# Patient Record
Sex: Female | Born: 1980 | Race: Black or African American | Hispanic: No | Marital: Single | Smoking: Current some day smoker
Health system: Southern US, Community
[De-identification: ages and names within clinical notes are randomized; demographics above are authoritative.]

## PROBLEM LIST (undated history)

## (undated) DIAGNOSIS — E119 Type 2 diabetes mellitus without complications: Secondary | ICD-10-CM

## (undated) HISTORY — PX: APPENDECTOMY: SHX54

## (undated) HISTORY — PX: TUBAL LIGATION: SHX77

---

## 2003-05-30 ENCOUNTER — Inpatient Hospital Stay (HOSPITAL_COMMUNITY): Admission: AD | Admit: 2003-05-30 | Discharge: 2003-05-30 | Payer: Self-pay | Admitting: Obstetrics & Gynecology

## 2004-03-05 ENCOUNTER — Emergency Department (HOSPITAL_COMMUNITY): Admission: EM | Admit: 2004-03-05 | Discharge: 2004-03-05 | Payer: Self-pay | Admitting: Emergency Medicine

## 2004-03-31 ENCOUNTER — Inpatient Hospital Stay (HOSPITAL_COMMUNITY): Admission: AD | Admit: 2004-03-31 | Discharge: 2004-04-01 | Payer: Self-pay | Admitting: Obstetrics

## 2004-04-06 ENCOUNTER — Inpatient Hospital Stay (HOSPITAL_COMMUNITY): Admission: AD | Admit: 2004-04-06 | Discharge: 2004-04-09 | Payer: Self-pay | Admitting: Obstetrics & Gynecology

## 2004-04-23 ENCOUNTER — Inpatient Hospital Stay (HOSPITAL_COMMUNITY): Admission: AD | Admit: 2004-04-23 | Discharge: 2004-04-23 | Payer: Self-pay | Admitting: Obstetrics

## 2004-04-24 ENCOUNTER — Ambulatory Visit (HOSPITAL_COMMUNITY): Admission: RE | Admit: 2004-04-24 | Discharge: 2004-04-24 | Payer: Self-pay | Admitting: Obstetrics & Gynecology

## 2004-05-07 ENCOUNTER — Inpatient Hospital Stay (HOSPITAL_COMMUNITY): Admission: AD | Admit: 2004-05-07 | Discharge: 2004-06-03 | Payer: Self-pay | Admitting: Obstetrics

## 2005-01-24 ENCOUNTER — Emergency Department (HOSPITAL_COMMUNITY): Admission: EM | Admit: 2005-01-24 | Discharge: 2005-01-24 | Payer: Self-pay | Admitting: Emergency Medicine

## 2005-03-21 ENCOUNTER — Ambulatory Visit (HOSPITAL_COMMUNITY): Admission: RE | Admit: 2005-03-21 | Discharge: 2005-03-21 | Payer: Self-pay | Admitting: Obstetrics & Gynecology

## 2005-10-01 ENCOUNTER — Ambulatory Visit (HOSPITAL_COMMUNITY): Admission: RE | Admit: 2005-10-01 | Discharge: 2005-10-01 | Payer: Self-pay | Admitting: Obstetrics

## 2005-10-02 ENCOUNTER — Inpatient Hospital Stay (HOSPITAL_COMMUNITY): Admission: RE | Admit: 2005-10-02 | Discharge: 2005-10-05 | Payer: Self-pay | Admitting: Obstetrics

## 2005-10-18 IMAGING — US US FETAL BPP W/O NONSTRESS
1 series · 14 of 28 positions shown · non-contrast
Comparison: none

CLINICAL DATA: 23-year-old.  G3 P0 for biophysical profile.

[Series 1: unknown · 0.37mm/px · 14 of 39 slices shown]
[im 2/39]
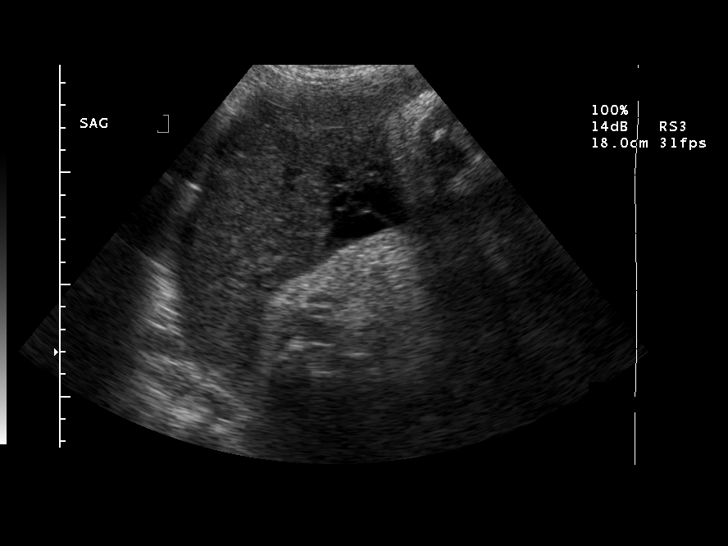
[im 5/39]
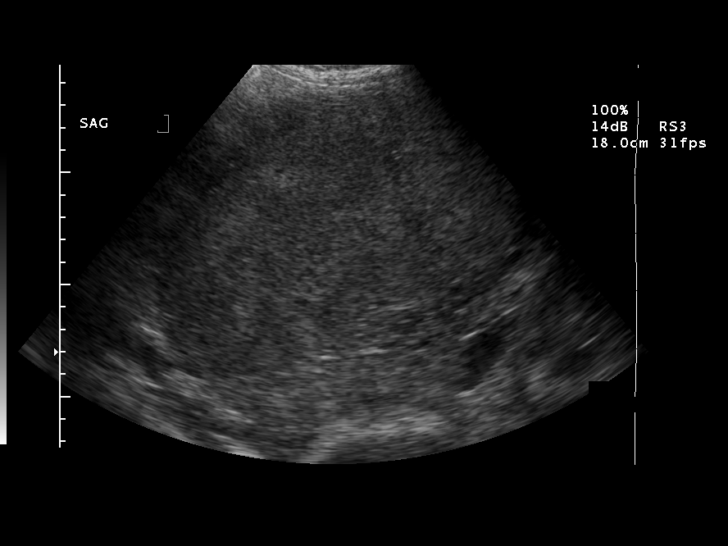
[im 8/39]
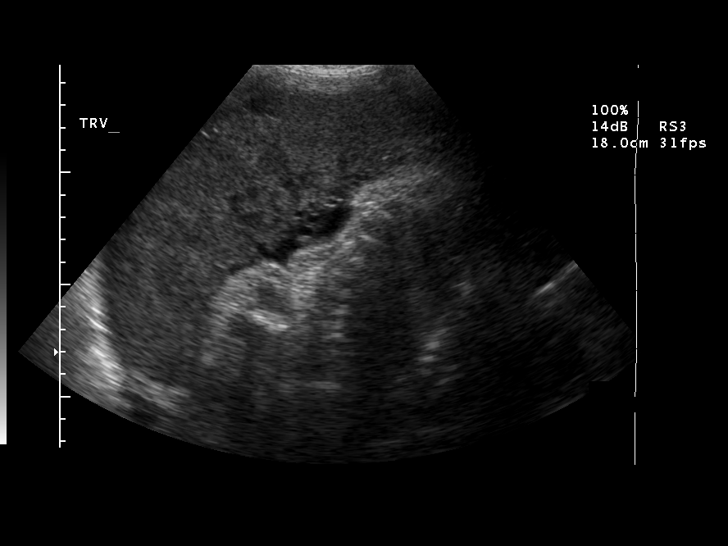
[im 10/39]
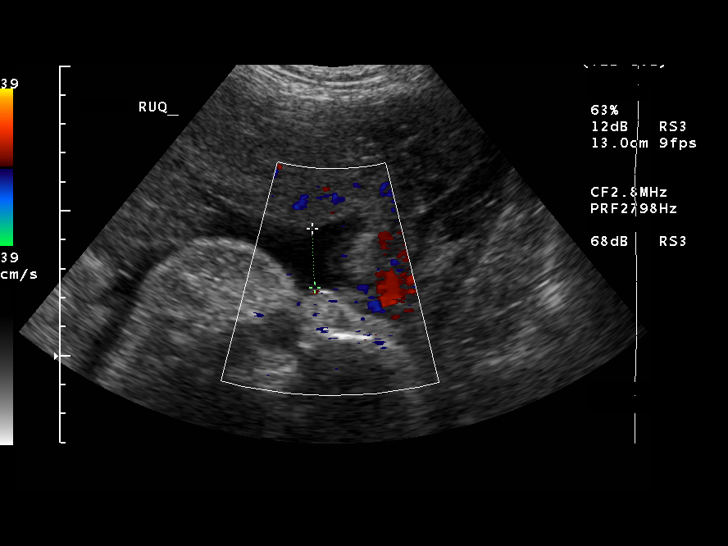
[im 13/39]
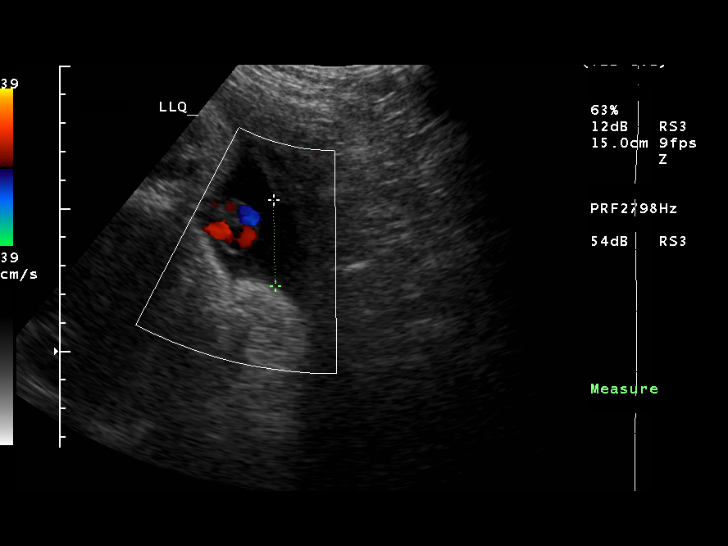
[im 16/39]
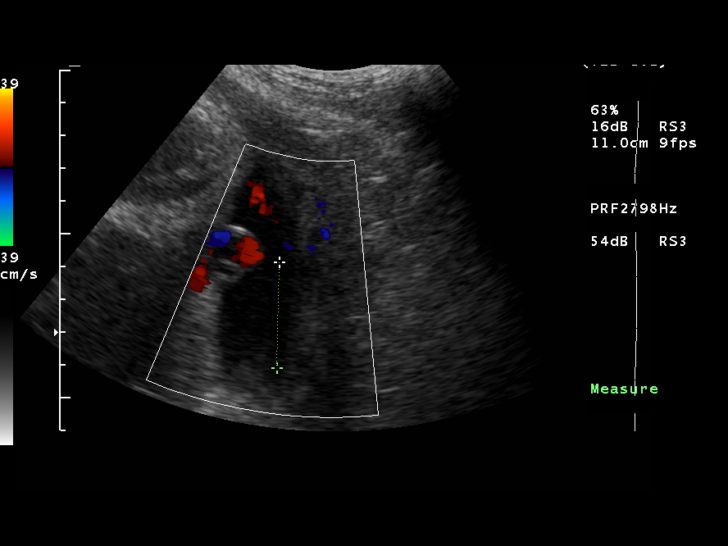
[im 19/39]
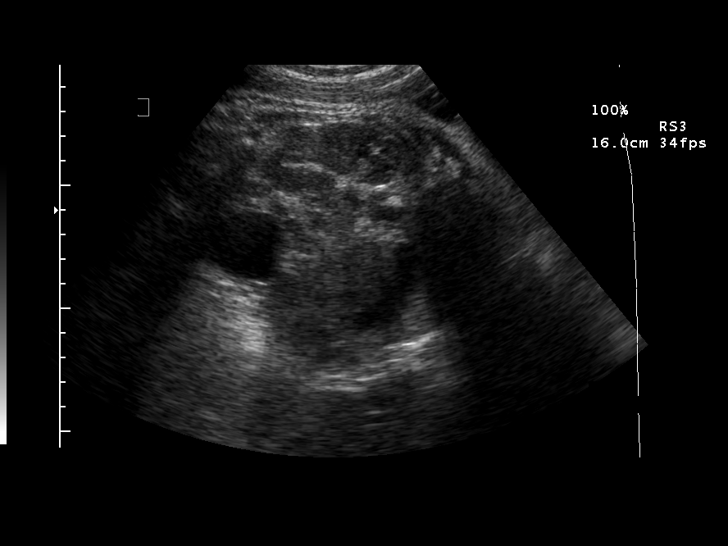
[im 22/39]
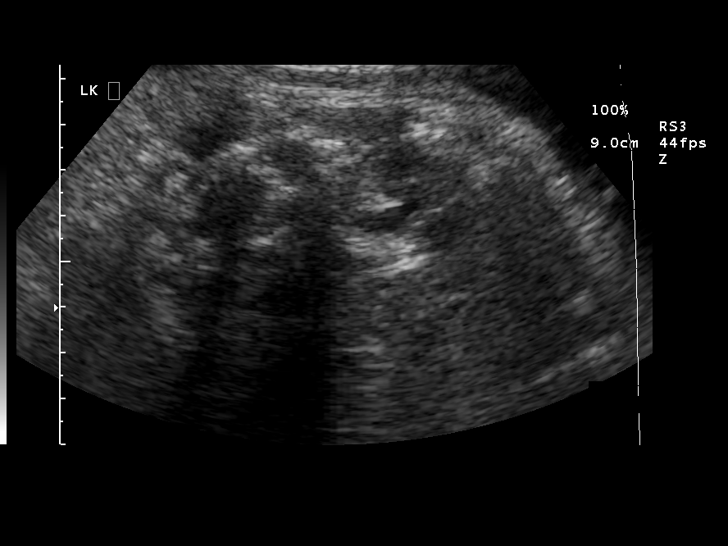
[im 24/39]
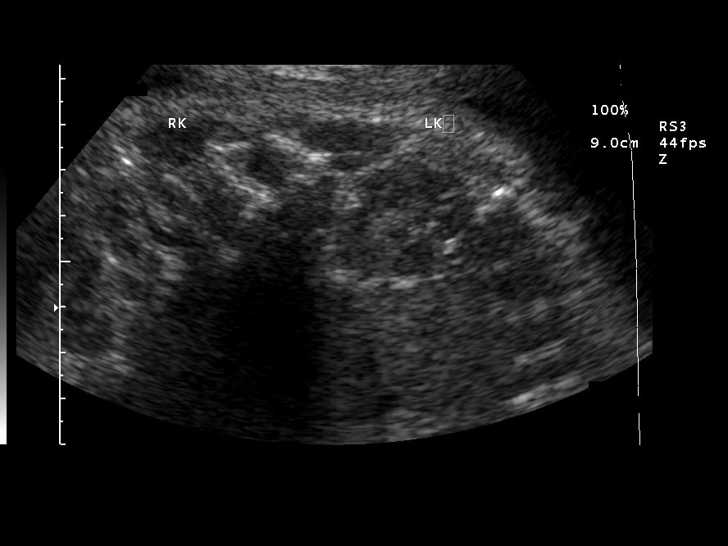
[im 27/39]
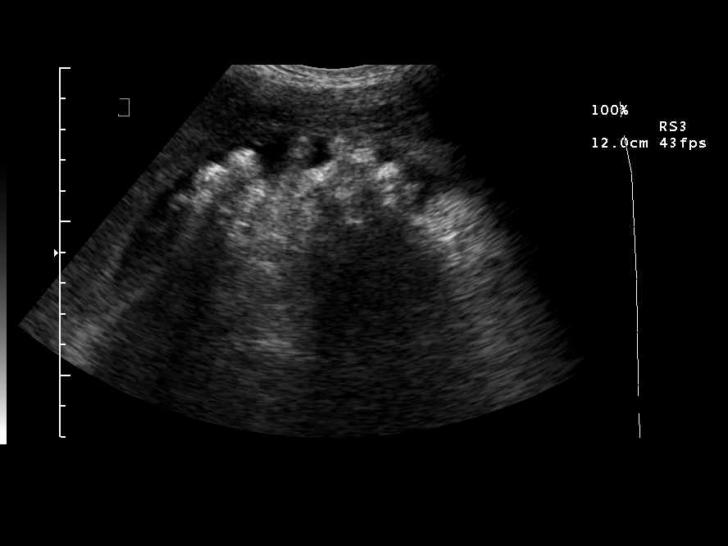
[im 30/39]
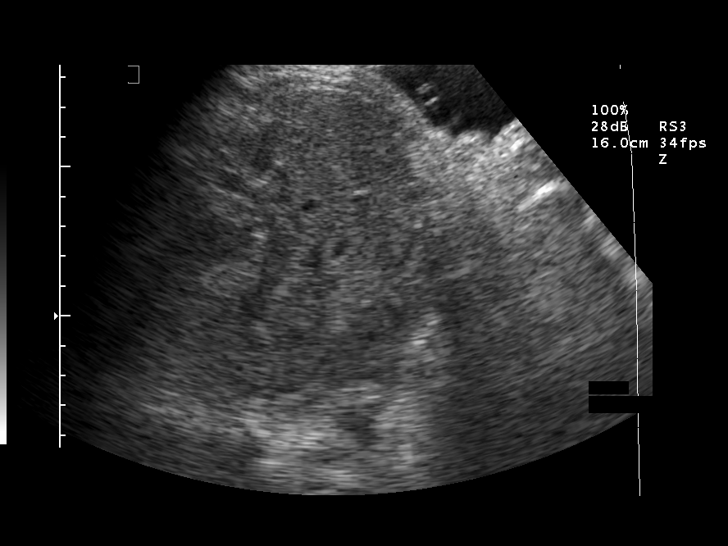
[im 33/39]
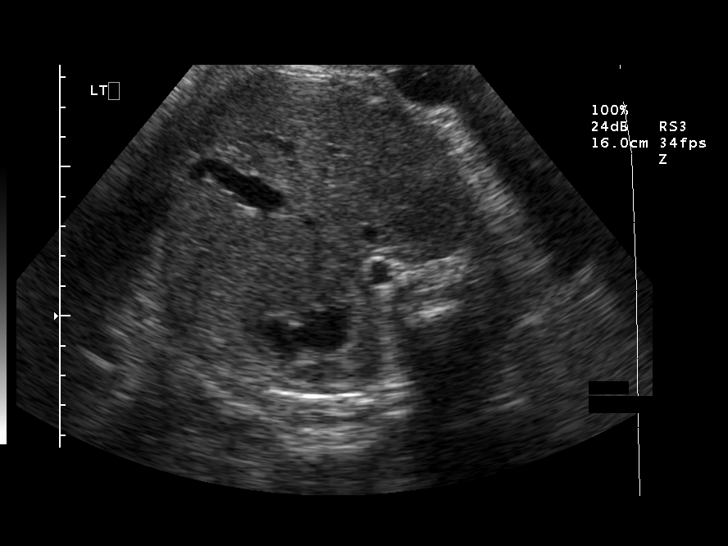
[im 36/39]
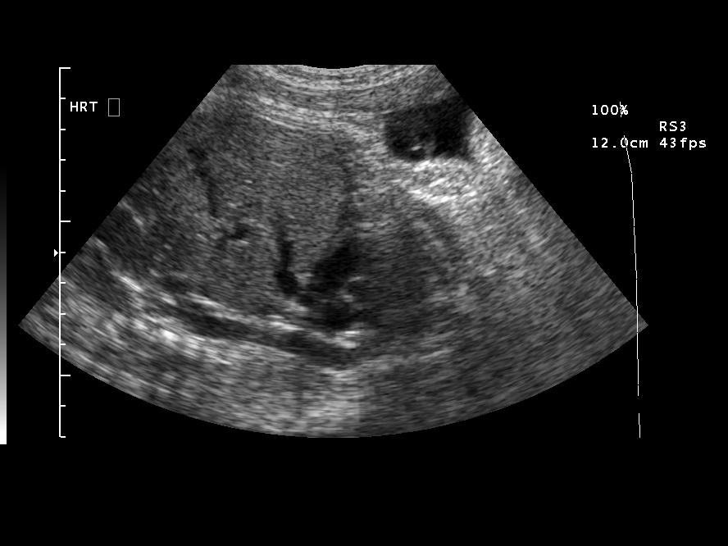
[im 39/39]
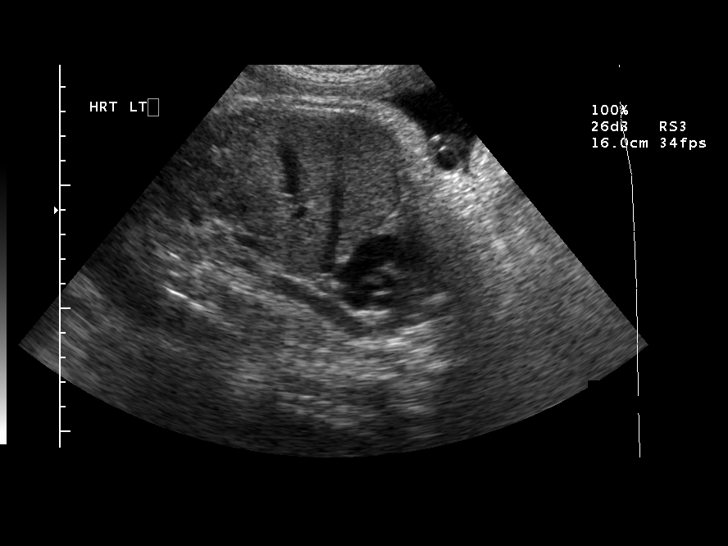

[14 of 28 positions shown; findings below may reference images not displayed]

BIOPHYSICAL PROFILE

 Number of Fetuses:  1
 Heart rate:  137
 Movement:  Yes
 Breathing:  No
 Presentation:  Cephalic/Oblique
 Placental Location:  Fundal, posterior
 Grade:  II
 Previa:  No
 Amniotic Fluid (Subjective):  Normal
 Amniotic Fluid (Objective):  14.7 cm AFI (5th -95th%ile = 7.5 ? 24.4 cm for 37 wks)

 Fetal measurements and complete anatomic evaluation were not requested.  The following fetal anatomy was visualized on this exam:  Stomach, kidneys, bladder, and diaphragm. 

 BPP SCORING
 Movements:  2  Time:  30 minutes
 Breathing:  0
 Tone:  2
 Amniotic Fluid:  2
 Total Score:  6

 MATERNAL FINDINGS:
 Cervix:  Not evaluated
IMPRESSION: Single living intrauterine fetus in oblique presentation.  Amniotic fluid volume is within normal limits.  Biophysical profile is 6 of 8.
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
## 2007-03-04 IMAGING — US US AMNIOCENTESIS
1 series · 2 of 2 positions shown · non-contrast
Comparison: none

[Series 1: us amniocentesis · 2 of 2 slices shown]
[im 1/2]
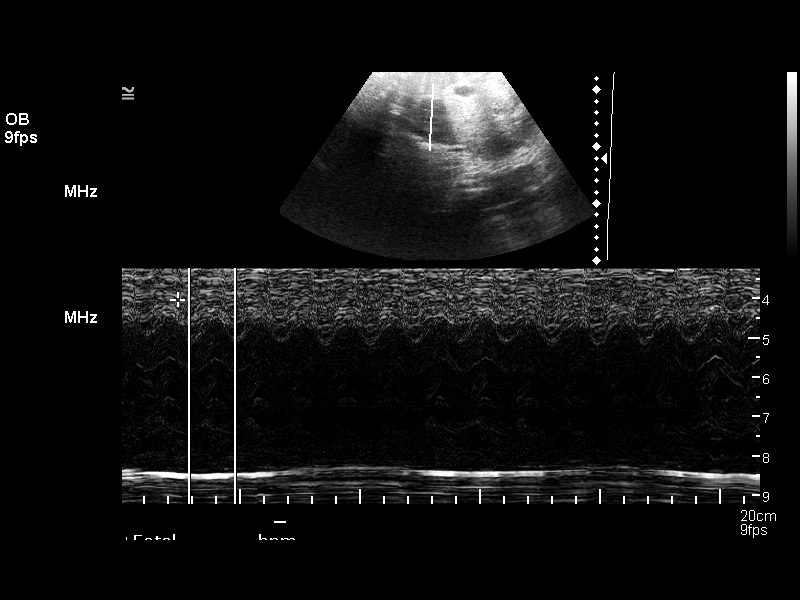
[im 2/2]
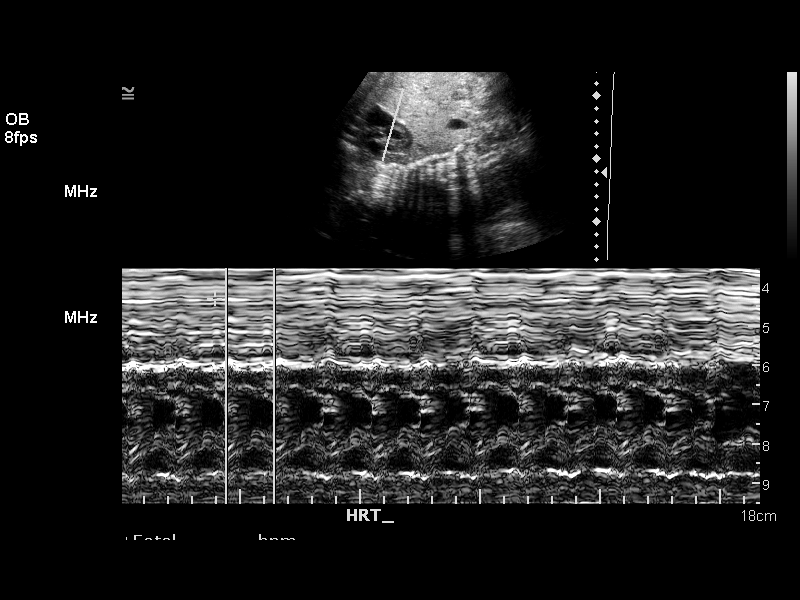

[2 of 2 positions shown; findings below may reference images not displayed]

ULTRASOUND AMNIOCENTESIS:

 Ultrasound was utilized to perform amniocentesis by the requesting physician.

## 2017-01-29 ENCOUNTER — Ambulatory Visit (HOSPITAL_COMMUNITY)
Admission: EM | Admit: 2017-01-29 | Discharge: 2017-01-29 | Disposition: A | Discharge status: Home / Self Care | Payer: Medicaid Other | Attending: Internal Medicine | Admitting: Internal Medicine

## 2017-01-29 ENCOUNTER — Encounter (HOSPITAL_COMMUNITY): Payer: Self-pay | Admitting: *Deleted

## 2017-01-29 DIAGNOSIS — R300 Dysuria: Secondary | ICD-10-CM | POA: Diagnosis not present

## 2017-01-29 DIAGNOSIS — Z79899 Other long term (current) drug therapy: Secondary | ICD-10-CM | POA: Diagnosis not present

## 2017-01-29 DIAGNOSIS — Z794 Long term (current) use of insulin: Secondary | ICD-10-CM | POA: Diagnosis not present

## 2017-01-29 DIAGNOSIS — D649 Anemia, unspecified: Secondary | ICD-10-CM | POA: Diagnosis not present

## 2017-01-29 DIAGNOSIS — N390 Urinary tract infection, site not specified: Secondary | ICD-10-CM | POA: Diagnosis not present

## 2017-01-29 DIAGNOSIS — R350 Frequency of micturition: Secondary | ICD-10-CM | POA: Insufficient documentation

## 2017-01-29 DIAGNOSIS — E860 Dehydration: Secondary | ICD-10-CM | POA: Insufficient documentation

## 2017-01-29 DIAGNOSIS — E1165 Type 2 diabetes mellitus with hyperglycemia: Secondary | ICD-10-CM | POA: Insufficient documentation

## 2017-01-29 HISTORY — DX: Type 2 diabetes mellitus without complications: E11.9

## 2017-01-29 LAB — POCT URINALYSIS DIP (DEVICE)
BILIRUBIN URINE: NEGATIVE
KETONES UR: NEGATIVE mg/dL
Nitrite: NEGATIVE
Protein, ur: 30 mg/dL — AB
SPECIFIC GRAVITY, URINE: 1.02 (ref 1.005–1.030)
Urobilinogen, UA: 1 mg/dL (ref 0.0–1.0)
pH: 6 (ref 5.0–8.0)

## 2017-01-29 LAB — POCT I-STAT, CHEM 8
BUN: 12 mg/dL (ref 6–20)
CHLORIDE: 101 mmol/L (ref 101–111)
CREATININE: 0.6 mg/dL (ref 0.44–1.00)
Calcium, Ion: 1.16 mmol/L (ref 1.15–1.40)
GLUCOSE: 277 mg/dL — AB (ref 65–99)
HEMATOCRIT: 32 % — AB (ref 36.0–46.0)
Hemoglobin: 10.9 g/dL — ABNORMAL LOW (ref 12.0–15.0)
POTASSIUM: 4 mmol/L (ref 3.5–5.1)
Sodium: 137 mmol/L (ref 135–145)
TCO2: 25 mmol/L (ref 0–100)

## 2017-01-29 MED ORDER — CEPHALEXIN 500 MG PO CAPS: 500.0000 mg | ORAL_CAPSULE | Freq: Four times a day (QID) | ORAL | 0 refills | Status: DC

## 2017-01-29 NOTE — ED Triage Notes (Signed)
Patient woke up this am dysuria and polyuria, denies any abdominal pain.

## 2017-01-29 NOTE — Discharge Instructions (Signed)
Be sure to take your blood sugar daily. It is currently 277. I think that you probably have some mild dehydration which may be causing your heart rate to be elevated. Continue to drink plenty of fluids. If in the next few days her heart rate does not come back down to normal that would be worrisome and you need to follow-up with your primary care doctor. You are being treated for urinary tract infection. A culture of your urine has been ordered as well. He may also take AZO for urinary tract infection symptoms. This tends to work relatively quickly and get help before the antibiotics start working. He will turn her urine reddish orange. If he started developing fevers, weakness, vomiting or other illness go to the emergency department promptly. This could mean that you have an infection in your blood strain, your kidney or problems with your diabetes. This could be serious and he would need to go to the emergency department.

## 2017-01-29 NOTE — ED Provider Notes (Signed)
CSN: [id number]     Arrival date & time 01/29/17  1421 History   None    Chief Complaint  Patient presents with  . Dysuria  . Polyuria   (Consider location/radiation/quality/duration/timing/severity/associated sxs/prior Treatment) 36 year old female with type 2 diabetes mellitus treated with insulin and metformin presents to the urgent care with a 2 day history of rather severe burning with urination and "bladder pain afterwards" and increased frequency of urination. She states that her urine seems to be cloudy and has an odor. She last checked her blood sugar yesterday and it was 123. She has noticed her heart seems to be having some palpitations and going a little fast and currently at a rate of 121.      Past Medical History:  Diagnosis Date  . Diabetes mellitus without complication (HCC)    History reviewed. No pertinent surgical history. History reviewed. No pertinent family history. Social History  Substance Use Topics  . Smoking status: Never Smoker  . Smokeless tobacco: Never Used  . Alcohol use Not on file   OB History    No data available     Review of Systems  Constitutional: Positive for activity change. Negative for fever.  HENT: Negative.   Respiratory: Negative.   Cardiovascular: Negative.   Gastrointestinal:       No current GI symptoms however last she had 3 days of vomiting and some diarrhea.  Genitourinary: Positive for dysuria, frequency, pelvic pain and urgency. Negative for vaginal discharge.  Neurological: Negative.   All other systems reviewed and are negative.   Allergies  Patient has no known allergies.  Home Medications   Prior to Admission medications   Medication Sig Start Date End Date Taking? Authorizing Provider  insulin glargine (LANTUS) 100 UNIT/ML injection Inject into the skin at bedtime.   Yes Historical Provider, MD  metFORMIN (GLUCOPHAGE) 1000 MG tablet Take 1,000 mg by mouth 2 (two) times daily with a meal.   Yes Historical  Provider, MD  cephALEXin (KEFLEX) 500 MG capsule Take 1 capsule (500 mg total) by mouth 4 (four) times daily. 01/29/17   Hayden Rasmussen, NP   Meds Ordered and Administered this Visit  Medications - No data to display  BP (!) 146/93 (BP Location: Right Arm)   Pulse (!) 121   Temp 99.6 F (37.6 C) (Oral)   Resp 18   LMP 01/19/2017   SpO2 100%  No data found.   Physical Exam  Constitutional: She is oriented to person, place, and time. She appears well-developed and well-nourished. No distress.  Eyes: EOM are normal.  Neck: Normal range of motion. Neck supple.  Cardiovascular: Normal rate, regular rhythm, normal heart sounds and intact distal pulses.   Pulmonary/Chest: Effort normal. No respiratory distress.  Musculoskeletal: She exhibits no edema.  Neurological: She is alert and oriented to person, place, and time. She exhibits normal muscle tone.  Skin: Skin is warm and dry.  Psychiatric: She has a normal mood and affect.  Nursing note and vitals reviewed.   Urgent Care Course     Procedures (including critical care time)  Labs Review Labs Reviewed  POCT URINALYSIS DIP (DEVICE) - Abnormal; Notable for the following:       Result Value   Glucose, UA >=1000 (*)    Hgb urine dipstick TRACE (*)    Protein, ur 30 (*)    Leukocytes, UA SMALL (*)    All other components within normal limits  POCT I-STAT, CHEM 8 - Abnormal; Notable for  the following:    Glucose, Bld 277 (*)    Hemoglobin 10.9 (*)    HCT 32.0 (*)    All other components within normal limits  URINE CULTURE    Imaging Review No results found.   Visual Acuity Review  Right Eye Distance:   Left Eye Distance:   Bilateral Distance:    Right Eye Near:   Left Eye Near:    Bilateral Near:         MDM   1. Lower urinary tract infectious disease   2. Dysuria   3. Type 2 diabetes mellitus with hyperglycemia, without long-term current use of insulin (HCC)   4. Mild dehydration   5. Anemia, unspecified  type    Be sure to take your blood sugar daily. It is currently 277. I think that you probably have some mild dehydration which may be causing your heart rate to be elevated. Continue to drink plenty of fluids. If in the next few days her heart rate does not come back down to normal that would be worrisome and you need to follow-up with your primary care doctor. You are being treated for urinary tract infection. A culture of your urine has been ordered as well. He may also take AZO for urinary tract infection symptoms. This tends to work relatively quickly and get help before the antibiotics start working. He will turn her urine reddish orange. If he started developing fevers, weakness, vomiting or other illness go to the emergency department promptly. This could mean that you have an infection in your blood strain, your kidney or problems with your diabetes. This could be serious and he would need to go to the emergency department.  Meds ordered this encounter  Medications  . metFORMIN (GLUCOPHAGE) 1000 MG tablet    Sig: Take 1,000 mg by mouth 2 (two) times daily with a meal.  . insulin glargine (LANTUS) 100 UNIT/ML injection    Sig: Inject into the skin at bedtime.  . cephALEXin (KEFLEX) 500 MG capsule    Sig: Take 1 capsule (500 mg total) by mouth 4 (four) times daily.    Dispense:  28 capsule    Refill:  0    Order Specific Question:   Supervising Provider    Answer:   Eustace Moore [[id number]]       Hayden Rasmussen, NP 01/29/17 1622    Hayden Rasmussen, NP 01/29/17 [id number]

## 2017-01-31 LAB — URINE CULTURE: Culture: >=100000 — AB

## 2018-04-04 ENCOUNTER — Ambulatory Visit (HOSPITAL_COMMUNITY)
Admission: EM | Admit: 2018-04-04 | Discharge: 2018-04-04 | Disposition: A | Discharge status: Home / Self Care | Payer: Medicaid Other | Attending: Family Medicine | Admitting: Family Medicine

## 2018-04-04 ENCOUNTER — Other Ambulatory Visit: Payer: Self-pay

## 2018-04-04 ENCOUNTER — Encounter (HOSPITAL_COMMUNITY): Payer: Self-pay | Admitting: *Deleted

## 2018-04-04 DIAGNOSIS — R42 Dizziness and giddiness: Secondary | ICD-10-CM

## 2018-04-04 DIAGNOSIS — E1165 Type 2 diabetes mellitus with hyperglycemia: Secondary | ICD-10-CM | POA: Diagnosis not present

## 2018-04-04 DIAGNOSIS — R739 Hyperglycemia, unspecified: Secondary | ICD-10-CM

## 2018-04-04 LAB — POCT URINALYSIS DIP (DEVICE)
Bilirubin Urine: NEGATIVE
Hgb urine dipstick: NEGATIVE
Ketones, ur: NEGATIVE mg/dL
Leukocytes, UA: NEGATIVE
NITRITE: NEGATIVE
PROTEIN: NEGATIVE mg/dL
Specific Gravity, Urine: 1.01 (ref 1.005–1.030)
UROBILINOGEN UA: 0.2 mg/dL (ref 0.0–1.0)
pH: 6 (ref 5.0–8.0)

## 2018-04-04 LAB — POCT I-STAT, CHEM 8
BUN: 10 mg/dL (ref 6–20)
CHLORIDE: 99 mmol/L (ref 98–111)
Calcium, Ion: 1.14 mmol/L — ABNORMAL LOW (ref 1.15–1.40)
Creatinine, Ser: 0.8 mg/dL (ref 0.44–1.00)
Glucose, Bld: 418 mg/dL — ABNORMAL HIGH (ref 70–99)
HCT: 31 % — ABNORMAL LOW (ref 36.0–46.0)
Hemoglobin: 10.5 g/dL — ABNORMAL LOW (ref 12.0–15.0)
Potassium: 3.9 mmol/L (ref 3.5–5.1)
Sodium: 137 mmol/L (ref 135–145)
TCO2: 23 mmol/L (ref 22–32)

## 2018-04-04 MED ORDER — INSULIN GLARGINE 100 UNIT/ML ~~LOC~~ SOLN: 20.0000 [IU] | Freq: Every day | SUBCUTANEOUS | 0 refills | Status: AC

## 2018-04-04 MED ORDER — METFORMIN HCL 1000 MG PO TABS: 1000.0000 mg | ORAL_TABLET | Freq: Two times a day (BID) | ORAL | 0 refills | Status: AC

## 2018-04-04 NOTE — Discharge Instructions (Addendum)
Please resume your metformin and continue Lantus as previously as prescribed to you  Continue to monitor your blood sugars  Please work on getting set up with a primary care  Please return here or go to emergency room if symptoms worsening, developing more fatigue, nausea, vomiting, dizziness

## 2018-04-04 NOTE — ED Triage Notes (Signed)
States her blood sugar was 412 at home today, normally takes Metformin, out of medication x 2 weeks.

## 2018-04-05 NOTE — ED Provider Notes (Signed)
[facility]    CSN: [id number] Arrival date & time: 04/04/18  1730     History   Chief Complaint Chief Complaint  Patient presents with  . Hyperglycemia    HPI Regina Robbins is a 37 y.o. female history of type 2 diabetes presenting today for evaluation of hyperglycemia and medication refill.  Patient states that she has been out of her metformin for approximately 2 weeks.  She notes that today she had increased fatigue, headache and dizziness.  She checked her blood sugar and it was around 418.  She states that approximately 2 to 3 days ago her blood sugars are running around 220.  When she was on her metformin they would typically run less than 200, but would be above 150.  She is also using Lantus, 20 units in the morning 22 at bedtime.  She does not have a PCP.  Denies nausea or vomiting.  Has noted increased frequency in urination, but denies dysuria.  HPI  Past Medical History:  Diagnosis Date  . Diabetes mellitus without complication (HCC)     There are no active problems to display for this patient.   Past Surgical History:  Procedure Laterality Date  . APPENDECTOMY    . TUBAL LIGATION      OB History   None      Home Medications    Prior to Admission medications   Medication Sig Start Date End Date Taking? Authorizing Provider  insulin glargine (LANTUS) 100 UNIT/ML injection Inject 0.2 mLs (20 Units total) into the skin at bedtime. 04/04/18   Torrin Frein C, PA-C  metFORMIN (GLUCOPHAGE) 1000 MG tablet Take 1 tablet (1,000 mg total) by mouth 2 (two) times daily with a meal. 04/04/18 07/03/18  Ledford Goodson, Junius CreamerHallie C, PA-C    Family History No family history on file.  Social History Social History   Tobacco Use  . Smoking status: Current Some Day Smoker  . Smokeless tobacco: Never Used  Substance Use Topics  . Alcohol use: Yes  . Drug use: Never     Allergies   Patient has no known allergies.   Review of Systems Review of Systems    Constitutional: Positive for fatigue. Negative for fever.  Eyes: Negative for visual disturbance.  Respiratory: Negative for shortness of breath.   Cardiovascular: Negative for chest pain.  Gastrointestinal: Negative for abdominal pain, diarrhea, nausea and vomiting.  Genitourinary: Positive for frequency. Negative for dysuria, flank pain, genital sores, hematuria, menstrual problem, vaginal bleeding, vaginal discharge and vaginal pain.  Musculoskeletal: Negative for back pain and myalgias.  Skin: Negative for rash.  Neurological: Positive for light-headedness and headaches. Negative for dizziness.     Physical Exam Triage Vital Signs ED Triage Vitals [04/04/18 1859]  Enc Vitals Group     BP 130/79     Pulse Rate 92     Resp 16     Temp 97.8 F (36.6 C)     Temp Source Oral     SpO2 97 %     Weight      Height      Head Circumference      Peak Flow      Pain Score 3     Pain Loc      Pain Edu?      Excl. in GC?    No data found.  Updated Vital Signs BP 130/79 (BP Location: Right Arm)   Pulse 92   Temp 97.8 F (36.6 C) (Oral)  Resp 16   LMP 03/28/2018   SpO2 97%   Visual Acuity Right Eye Distance:   Left Eye Distance:   Bilateral Distance:    Right Eye Near:   Left Eye Near:    Bilateral Near:     Physical Exam  Constitutional: She is oriented to person, place, and time. She appears well-developed and well-nourished. No distress.  HENT:  Head: Normocephalic and atraumatic.  Mouth/Throat: Oropharynx is clear and moist.  No fruity breath  Eyes: Pupils are equal, round, and reactive to light. Conjunctivae and EOM are normal.  Neck: Neck supple.  Cardiovascular: Normal rate and regular rhythm.  No murmur heard. Pulmonary/Chest: Effort normal and breath sounds normal. No respiratory distress.  Abdominal: Soft. There is no tenderness.  Nontender to light deep palpation throughout abdomen  Musculoskeletal: She exhibits no edema.  Neurological: She is alert  and oriented to person, place, and time. No cranial nerve deficit. Coordination normal.  Skin: Skin is warm and dry.  Psychiatric: She has a normal mood and affect.  Nursing note and vitals reviewed.    UC Treatments / Results  Labs (all labs ordered are listed, but only abnormal results are displayed) Labs Reviewed  POCT I-STAT, CHEM 8 - Abnormal; Notable for the following components:      Result Value   Glucose, Bld 418 (*)    Calcium, Ion 1.14 (*)    Hemoglobin 10.5 (*)    HCT 31.0 (*)    All other components within normal limits  POCT URINALYSIS DIP (DEVICE) - Abnormal; Notable for the following components:   Glucose, UA >=1000 (*)    All other components within normal limits    EKG None  Radiology No results found.  Procedures Procedures (including critical care time)  Medications Ordered in UC Medications - No data to display  Initial Impression / Assessment and Plan / UC Course  I have reviewed the triage vital signs and the nursing notes.  Pertinent labs & imaging results that were available during my care of the patient were reviewed by me and considered in my medical decision making (see chart for details).     Patient with hyperglycemia, blood sugar is 418 today.  UA showing glucose, but no ketones.  I-STAT also showing slightly low hemoglobin.  We will refill patient's metformin, will also provide more Lantus for her to continue to use as prescribed previously.  Discussed getting set up with a PCP, provided information for [facility] community health and wellness.  Advised her to monitor her sugars.  Return here go to emergency room if symptoms worsening.Discussed strict return precautions. Patient verbalized understanding and is agreeable with plan.  Final Clinical Impressions(s) / UC Diagnoses   Final diagnoses:  Hyperglycemia     Discharge Instructions     Please resume your metformin and continue Lantus as previously as prescribed to  you  Continue to monitor your blood sugars  Please work on getting set up with a primary care  Please return here or go to emergency room if symptoms worsening, developing more fatigue, nausea, vomiting, dizziness     ED Prescriptions    Medication Sig Dispense Auth. Provider   metFORMIN (GLUCOPHAGE) 1000 MG tablet Take 1 tablet (1,000 mg total) by mouth 2 (two) times daily with a meal. 180 tablet Jamylah Marinaccio C, PA-C   insulin glargine (LANTUS) 100 UNIT/ML injection Inject 0.2 mLs (20 Units total) into the skin at bedtime. 50 mL Kathreen Dileo, [place] C, PA-C  Controlled Substance Prescriptions [place] Controlled Substance Registry consulted? Not Applicable   Lew Dawes, [place] 04/05/18 1046

## 2018-07-18 ENCOUNTER — Ambulatory Visit (HOSPITAL_COMMUNITY)
Admission: EM | Admit: 2018-07-18 | Discharge: 2018-07-18 | Disposition: A | Discharge status: Home / Self Care | Payer: Medicaid Other | Attending: Family Medicine | Admitting: Family Medicine

## 2018-07-18 ENCOUNTER — Encounter (HOSPITAL_COMMUNITY): Payer: Self-pay | Admitting: Emergency Medicine

## 2018-07-18 DIAGNOSIS — R81 Glycosuria: Secondary | ICD-10-CM | POA: Insufficient documentation

## 2018-07-18 DIAGNOSIS — E119 Type 2 diabetes mellitus without complications: Secondary | ICD-10-CM | POA: Insufficient documentation

## 2018-07-18 DIAGNOSIS — E1165 Type 2 diabetes mellitus with hyperglycemia: Secondary | ICD-10-CM

## 2018-07-18 DIAGNOSIS — F17200 Nicotine dependence, unspecified, uncomplicated: Secondary | ICD-10-CM | POA: Insufficient documentation

## 2018-07-18 DIAGNOSIS — R350 Frequency of micturition: Secondary | ICD-10-CM

## 2018-07-18 DIAGNOSIS — B9689 Other specified bacterial agents as the cause of diseases classified elsewhere: Secondary | ICD-10-CM | POA: Insufficient documentation

## 2018-07-18 DIAGNOSIS — Z794 Long term (current) use of insulin: Secondary | ICD-10-CM | POA: Insufficient documentation

## 2018-07-18 DIAGNOSIS — N3001 Acute cystitis with hematuria: Secondary | ICD-10-CM | POA: Insufficient documentation

## 2018-07-18 DIAGNOSIS — R300 Dysuria: Secondary | ICD-10-CM

## 2018-07-18 LAB — POCT URINALYSIS DIP (DEVICE)
Bilirubin Urine: NEGATIVE
Glucose, UA: >=1000 mg/dL — AB
Ketones, ur: NEGATIVE mg/dL
NITRITE: NEGATIVE
PH: 6.5 (ref 5.0–8.0)
PROTEIN: NEGATIVE mg/dL
Specific Gravity, Urine: 1.02 (ref 1.005–1.030)
Urobilinogen, UA: 0.2 mg/dL (ref 0.0–1.0)

## 2018-07-18 LAB — GLUCOSE, CAPILLARY: GLUCOSE-CAPILLARY: 183 mg/dL — AB (ref 70–99)

## 2018-07-18 MED ORDER — NITROFURANTOIN MONOHYD MACRO 100 MG PO CAPS: 100.0000 mg | ORAL_CAPSULE | Freq: Two times a day (BID) | ORAL | 0 refills | Status: DC

## 2018-07-18 MED ORDER — PHENAZOPYRIDINE HCL 200 MG PO TABS: 200.0000 mg | ORAL_TABLET | Freq: Three times a day (TID) | ORAL | 0 refills | Status: DC

## 2018-07-18 NOTE — Discharge Instructions (Signed)
Blood sugar 183.   Urine culture sent.  We will call you with the results.   Push fluids and get plenty of rest.   Take antibiotic as directed and to completion Take pyridium as prescribed and as needed for symptomatic relief Recommend repeating urine analysis in 1-2 weeks with PCP PCP assistance initiated  Follow up with PCP or [facility] to establish care and/or if symptoms persists Return here or go to ER if you have any new or worsening symptoms such as fever, worsening abdominal pain, nausea/vomiting, flank pain, etc..Marland Kitchen

## 2018-07-18 NOTE — ED Triage Notes (Signed)
Pt c/o burning frequency, urgency with urination x2 days

## 2018-07-18 NOTE — ED Provider Notes (Signed)
[facility]   CC: UTI symptoms  SUBJECTIVE:  Regina Robbins is a 37 y.o. female who complains of urinary frequency, urgency and dysuria for the past couple of days.  Admits to excessive caffeine use.  Denies delayed bathroom breaks or recent sexual activity. Has NOT tried OTC medications.  Symptoms are made worse with urination.  Admits to similar symptoms in the past.  Denies fever, chills, nausea, vomiting, abdominal pain, flank pain, abnormal vaginal discharge or bleeding, hematuria.    Patient hx significant for DM x multiple years.  On metformin and insulin daily.  States she is compliant with her medications.  Blood glucose on average is less than 200.  Has not check blood glucose today because she ran out of strips.  Moved to [place] in April.  Denies lightheadedness, dizziness, polydipsia, weight loss, numbness or tingling in extremities.    LMP: No LMP recorded.  ROS: As in HPI.  Past Medical History:  Diagnosis Date  . Diabetes mellitus without complication [facility])    Past Surgical History:  Procedure Laterality Date  . APPENDECTOMY    . TUBAL LIGATION     No Known Allergies No current facility-administered medications on file prior to encounter.    Current Outpatient Medications on File Prior to Encounter  Medication Sig Dispense Refill  . insulin glargine (LANTUS) 100 UNIT/ML injection Inject 0.2 mLs (20 Units total) into the skin at bedtime. 50 mL 0  . metFORMIN (GLUCOPHAGE) 1000 MG tablet Take 1 tablet (1,000 mg total) by mouth 2 (two) times daily with a meal. 180 tablet 0   Social History   Socioeconomic History  . Marital status: Single    Spouse name: Not on file  . Number of children: Not on file  . Years of education: Not on file  . Highest education level: Not on file  Occupational History  . Not on file  Social Needs  . Financial resource strain: Not on file  . Food insecurity:    Worry: Not on file    Inability: Not on file  .  Transportation needs:    Medical: Not on file    Non-medical: Not on file  Tobacco Use  . Smoking status: Current Some Day Smoker  . Smokeless tobacco: Never Used  Substance and Sexual Activity  . Alcohol use: Yes  . Drug use: Never  . Sexual activity: Not on file  Lifestyle  . Physical activity:    Days per week: Not on file    Minutes per session: Not on file  . Stress: Not on file  Relationships  . Social connections:    Talks on phone: Not on file    Gets together: Not on file    Attends religious service: Not on file    Active member of club or organization: Not on file    Attends meetings of clubs or organizations: Not on file    Relationship status: Not on file  . Intimate partner violence:    Fear of current or ex partner: Not on file    Emotionally abused: Not on file    Physically abused: Not on file    Forced sexual activity: Not on file  Other Topics Concern  . Not on file  Social History Narrative  . Not on file   No family history on file.  OBJECTIVE:  Vitals:   07/18/18 1138  BP: 131/84  Pulse: 88  Resp: 16  Temp: 98 F (36.7 C)  SpO2: 100%  General appearance: AOx3 in no acute distress; nontoxic appearance HEENT: NCAT.  Oropharynx clear.  Lungs: clear to auscultation bilaterally without adventitious breath sounds Heart: regular rate and rhythm.  Radial pulses 2+ symmetrical bilaterally Abdomen: soft; non-distended; no tenderness; bowel sounds present; no guarding Back: no CVA tenderness Extremities: no edema; symmetrical with no gross deformities Skin: warm and dry Neurologic: Ambulates from chair to exam table without difficulty Psychological: alert and cooperative; normal mood and affect  Labs Reviewed  GLUCOSE, CAPILLARY - Abnormal; Notable for the following components:      Result Value   Glucose-Capillary 183 (*)    All other components within normal limits  POCT URINALYSIS DIP (DEVICE) - Abnormal; Notable for the following  components:   Glucose, UA >=1000 (*)    Hgb urine dipstick SMALL (*)    Leukocytes, UA SMALL (*)    All other components within normal limits  URINE CULTURE    ASSESSMENT & PLAN:  1. Acute cystitis with hematuria   2. Glycosuria   3. Type 2 diabetes mellitus without complication, with long-term current use of insulin (HCC)     Meds ordered this encounter  Medications  . nitrofurantoin, macrocrystal-monohydrate, (MACROBID) 100 MG capsule    Sig: Take 1 capsule (100 mg total) by mouth 2 (two) times daily.    Dispense:  10 capsule    Refill:  0    Order Specific Question:   Supervising Provider    Answer:   KALEEAH, GINGERICH [phone]  . phenazopyridine (PYRIDIUM) 200 MG tablet    Sig: Take 1 tablet (200 mg total) by mouth 3 (three) times daily.    Dispense:  6 tablet    Refill:  0    Order Specific Question:   Supervising Provider    Answer:   CZARINA, GINGRAS [[id number]]   Blood sugar 183.   Urine culture sent.  We will call you with the results.   Push fluids and get plenty of rest.   Take antibiotic as directed and to completion Take pyridium as prescribed and as needed for symptomatic relief Recommend repeating urine analysis in 1-2 weeks with PCP PCP assistance initiated  Follow up with PCP or [facility] to establish care and/or if symptoms persists Return here or go to ER if you have any new or worsening symptoms such as fever, worsening abdominal pain, nausea/vomiting, flank pain, etc...  Outlined signs and symptoms indicating need for more acute intervention. Patient verbalized understanding. After Visit Summary given.     Rennis Harding, PA-C 07/18/18 1326

## 2018-07-20 ENCOUNTER — Telehealth (HOSPITAL_COMMUNITY): Payer: Self-pay

## 2018-07-20 LAB — URINE CULTURE: Culture: 70000 — AB

## 2018-07-20 MED ORDER — FLUCONAZOLE 150 MG PO TABS: 150.0000 mg | ORAL_TABLET | Freq: Every day | ORAL | 0 refills | Status: AC

## 2018-07-20 MED ORDER — SULFAMETHOXAZOLE-TRIMETHOPRIM 800-160 MG PO TABS: 1.0000 | ORAL_TABLET | Freq: Two times a day (BID) | ORAL | 0 refills | Status: AC

## 2018-07-20 NOTE — Telephone Encounter (Signed)
Urine culture positive for Klebsiella Pneumoniae. This will not be properly treated with Macrobid given at ucc visit. rx for Bactrim BID x 5 days sent to pharmacy of choice. Pt called and made aware. Request for rx for Diflucan to treat yeast with antibiotic usage.

## 2018-09-28 ENCOUNTER — Ambulatory Visit: Payer: Self-pay | Admitting: Family Medicine

## 2018-10-05 ENCOUNTER — Other Ambulatory Visit: Payer: Self-pay

## 2018-10-05 ENCOUNTER — Encounter (HOSPITAL_COMMUNITY): Payer: Self-pay | Admitting: Emergency Medicine

## 2018-10-05 ENCOUNTER — Ambulatory Visit (HOSPITAL_COMMUNITY)
Admission: EM | Admit: 2018-10-05 | Discharge: 2018-10-05 | Disposition: A | Discharge status: Home / Self Care | Payer: No Typology Code available for payment source

## 2018-10-05 DIAGNOSIS — K0889 Other specified disorders of teeth and supporting structures: Secondary | ICD-10-CM

## 2018-10-05 MED ORDER — AMOXICILLIN-POT CLAVULANATE 875-125 MG PO TABS: 1.0000 | ORAL_TABLET | Freq: Two times a day (BID) | ORAL | 0 refills | Status: AC

## 2018-10-05 NOTE — ED Triage Notes (Signed)
Left side of head is painful, left, upper "palate" pain .  Pain started saturday

## 2018-10-05 NOTE — ED Provider Notes (Signed)
  MRN: [id number] DOB: 12/13/1980  Subjective:   Regina Robbins is a 37 y.o. female presenting for 3 day history of worsening sharp/throbbing upper hard palate pain over left medial side toward posterior molars. Knows that she needs to have some teeth removed but has not set up an appointment with a dentist yet. Has not felt any abrasion, injury. Has tried APAP and ibuprofen which have not stopped working.  Denies fever, sinus pain, ear pain, throat pain, cough, nausea, vomiting, belly pain, malaise.  Takes insulin and metformin.   No Known Allergies  Past Medical History:  Diagnosis Date  . Diabetes mellitus without complication [facility](H[facility])      Past Surgical History:  Procedure Laterality Date  . APPENDECTOMY    . TUBAL LIGATION      Objective:   Vitals: BP 137/88 (BP Location: Left Arm)   Pulse 90   Temp 98.3 F (36.8 C) (Oral)   Resp 18   SpO2 100%   Physical Exam Constitutional:      General: She is not in acute distress.    Appearance: Normal appearance. She is well-developed and normal weight. She is not ill-appearing, toxic-appearing or diaphoretic.  HENT:     Head: Normocephalic and atraumatic.     Right Ear: External ear normal.     Left Ear: External ear normal.     Mouth/Throat:     Mouth: Mucous membranes are moist. No injury, lacerations, oral lesions or angioedema.     Dentition: No gingival swelling, dental abscesses or gum lesions.     Tongue: No lesions.     Palate: No mass and lesions. Palate does not elevate in midline.     Pharynx: Oropharynx is clear. Uvula midline. No pharyngeal swelling, oropharyngeal exudate, posterior oropharyngeal erythema or uvula swelling.   Cardiovascular:     Rate and Rhythm: Normal rate.  Pulmonary:     Effort: Pulmonary effort is normal.  Neurological:     Mental Status: She is alert and oriented to person, place, and time.  Psychiatric:        Mood and Affect: Mood normal.        Behavior: Behavior normal.      Thought Content: Thought content normal.     Assessment and Plan :   Pain, dental  There is no frank lesion and no known injury or trauma from food for instance.  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  Will start Augmentin and she is going to contact the dentist/oral surgeon.  ER and return to clinic precautions reviewed.     Wallis BambergMani, Vita Currin, [place]PA-C 10/05/18 [phone]

## 2018-10-05 NOTE — Discharge Instructions (Signed)
You may take 500mg  Tylenol with ibuprofen 400-600mg  every 6 hours for pain and inflammation of oral cavity/teeth.

## 2019-02-05 DIAGNOSIS — Z0000 Encounter for general adult medical examination without abnormal findings: Secondary | ICD-10-CM | POA: Diagnosis not present

## 2019-02-05 DIAGNOSIS — Z794 Long term (current) use of insulin: Secondary | ICD-10-CM | POA: Diagnosis not present

## 2019-02-05 DIAGNOSIS — E663 Overweight: Secondary | ICD-10-CM | POA: Diagnosis not present

## 2019-02-05 DIAGNOSIS — Z1331 Encounter for screening for depression: Secondary | ICD-10-CM | POA: Diagnosis not present

## 2019-02-05 DIAGNOSIS — N898 Other specified noninflammatory disorders of vagina: Secondary | ICD-10-CM | POA: Diagnosis not present

## 2019-02-05 DIAGNOSIS — E119 Type 2 diabetes mellitus without complications: Secondary | ICD-10-CM | POA: Diagnosis not present

## 2019-02-09 DIAGNOSIS — E119 Type 2 diabetes mellitus without complications: Secondary | ICD-10-CM | POA: Diagnosis not present

## 2019-02-09 DIAGNOSIS — Z794 Long term (current) use of insulin: Secondary | ICD-10-CM | POA: Diagnosis not present

## 2019-03-09 DIAGNOSIS — E663 Overweight: Secondary | ICD-10-CM | POA: Diagnosis not present

## 2019-03-09 DIAGNOSIS — Z794 Long term (current) use of insulin: Secondary | ICD-10-CM | POA: Diagnosis not present

## 2019-03-09 DIAGNOSIS — E119 Type 2 diabetes mellitus without complications: Secondary | ICD-10-CM | POA: Diagnosis not present

## 2019-04-08 DIAGNOSIS — D649 Anemia, unspecified: Secondary | ICD-10-CM | POA: Diagnosis not present

## 2019-04-08 DIAGNOSIS — E7800 Pure hypercholesterolemia, unspecified: Secondary | ICD-10-CM | POA: Diagnosis not present

## 2019-04-08 DIAGNOSIS — Z794 Long term (current) use of insulin: Secondary | ICD-10-CM | POA: Diagnosis not present

## 2019-04-08 DIAGNOSIS — E1165 Type 2 diabetes mellitus with hyperglycemia: Secondary | ICD-10-CM | POA: Diagnosis not present

## 2019-04-12 DIAGNOSIS — M791 Myalgia, unspecified site: Secondary | ICD-10-CM | POA: Diagnosis not present

## 2019-04-12 DIAGNOSIS — E1165 Type 2 diabetes mellitus with hyperglycemia: Secondary | ICD-10-CM | POA: Diagnosis not present

## 2019-04-12 DIAGNOSIS — R7989 Other specified abnormal findings of blood chemistry: Secondary | ICD-10-CM | POA: Diagnosis not present

## 2019-04-12 DIAGNOSIS — E7800 Pure hypercholesterolemia, unspecified: Secondary | ICD-10-CM | POA: Diagnosis not present

## 2019-06-01 DIAGNOSIS — E1165 Type 2 diabetes mellitus with hyperglycemia: Secondary | ICD-10-CM | POA: Diagnosis not present

## 2019-06-01 DIAGNOSIS — B351 Tinea unguium: Secondary | ICD-10-CM | POA: Diagnosis not present

## 2019-06-01 DIAGNOSIS — Z794 Long term (current) use of insulin: Secondary | ICD-10-CM | POA: Diagnosis not present

## 2019-07-12 DIAGNOSIS — K5909 Other constipation: Secondary | ICD-10-CM | POA: Diagnosis not present

## 2019-07-12 DIAGNOSIS — E7800 Pure hypercholesterolemia, unspecified: Secondary | ICD-10-CM | POA: Diagnosis not present

## 2019-07-12 DIAGNOSIS — E1165 Type 2 diabetes mellitus with hyperglycemia: Secondary | ICD-10-CM | POA: Diagnosis not present

## 2019-07-12 DIAGNOSIS — D509 Iron deficiency anemia, unspecified: Secondary | ICD-10-CM | POA: Diagnosis not present

## 2019-07-15 DIAGNOSIS — B351 Tinea unguium: Secondary | ICD-10-CM | POA: Diagnosis not present
